# Patient Record
Sex: Female | Born: 1995 | Race: Black or African American | Hispanic: No | Marital: Single | Smoking: Light tobacco smoker
Health system: Southern US, Community
[De-identification: ages and names within clinical notes are randomized; demographics above are authoritative.]

---

## 2014-12-11 ENCOUNTER — Encounter: Payer: Self-pay | Admitting: Family

## 2014-12-11 ENCOUNTER — Ambulatory Visit (INDEPENDENT_AMBULATORY_CARE_PROVIDER_SITE_OTHER): Payer: BC Managed Care – PPO | Admitting: Family

## 2014-12-11 VITALS — BP 104/76 | HR 70 | Temp 97.9°F | Ht 63.8 in | Wt 185.0 lb

## 2014-12-11 DIAGNOSIS — Z0000 Encounter for general adult medical examination without abnormal findings: Secondary | ICD-10-CM

## 2014-12-11 DIAGNOSIS — Z3201 Encounter for pregnancy test, result positive: Secondary | ICD-10-CM

## 2014-12-11 DIAGNOSIS — R350 Frequency of micturition: Secondary | ICD-10-CM

## 2014-12-11 LAB — URINALYSIS
BILIRUBIN URINE: NEGATIVE
Hgb urine dipstick: NEGATIVE
Ketones, ur: NEGATIVE
LEUKOCYTES UA: NEGATIVE
Nitrite: NEGATIVE
SPECIFIC GRAVITY, URINE: 1.025 (ref 1.000–1.030)
Total Protein, Urine: NEGATIVE
UROBILINOGEN UA: 0.2 (ref 0.0–1.0)
Urine Glucose: NEGATIVE
pH: 6 (ref 5.0–8.0)

## 2014-12-11 LAB — CBC WITH DIFFERENTIAL/PLATELET
BASOS ABS: 0 10*3/uL (ref 0.0–0.1)
BASOS PCT: 0.7 % (ref 0.0–3.0)
Eosinophils Absolute: 0 10*3/uL (ref 0.0–0.7)
Eosinophils Relative: 0.6 % (ref 0.0–5.0)
HCT: 36.9 % (ref 36.0–46.0)
HEMOGLOBIN: 12 g/dL (ref 12.0–15.0)
LYMPHS PCT: 28.8 % (ref 12.0–46.0)
Lymphs Abs: 1.7 10*3/uL (ref 0.7–4.0)
MCHC: 32.5 g/dL (ref 30.0–36.0)
MCV: 81.8 fl (ref 78.0–100.0)
MONO ABS: 0.4 10*3/uL (ref 0.1–1.0)
Monocytes Relative: 6.9 % (ref 3.0–12.0)
NEUTROS ABS: 3.8 10*3/uL (ref 1.4–7.7)
NEUTROS PCT: 63 % (ref 43.0–77.0)
Platelets: 338 10*3/uL (ref 150.0–400.0)
RBC: 4.51 Mil/uL (ref 3.87–5.11)
RDW: 15.1 % — AB (ref 11.5–14.6)
WBC: 6.1 10*3/uL (ref 4.5–10.5)

## 2014-12-11 LAB — TSH: TSH: 2.1 u[IU]/mL (ref 0.35–5.50)

## 2014-12-11 LAB — HCG, QUANTITATIVE, PREGNANCY: Quantitative HCG: 0.15 m[IU]/mL

## 2014-12-11 MED ORDER — PRENATAL VITAMINS 28-0.8 MG PO TABS: 1.0000 | ORAL_TABLET | Freq: Every day | ORAL | Status: DC

## 2014-12-11 NOTE — Patient Instructions (Addendum)
Start a prenatal vitamin with folic acid. This is available over the counter.  Please complete lab work prior to leaving. Try to get 30 minutes of walking 5 days a week. Avoid fast food.  Each more fresh/fruits/veggies. You will be contacted with your pregnancy test results and further recommendations.  Follow up in 1 year sooner if problems.
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
## 2014-12-12 ENCOUNTER — Encounter: Payer: Self-pay | Admitting: Family

## 2014-12-12 ENCOUNTER — Telehealth: Payer: Self-pay | Admitting: Family

## 2014-12-12 DIAGNOSIS — Z3201 Encounter for pregnancy test, result positive: Secondary | ICD-10-CM

## 2014-12-12 LAB — BASIC METABOLIC PANEL
BUN: 9 mg/dL (ref 6–23)
CALCIUM: 9.4 mg/dL (ref 8.4–10.5)
CO2: 22 mEq/L (ref 19–32)
CREATININE: 0.7 mg/dL (ref 0.4–1.2)
Chloride: 107 mEq/L (ref 96–112)
GFR: 139.09 mL/min (ref >60.00)
GLUCOSE: 86 mg/dL (ref 70–99)
Potassium: 4 mEq/L (ref 3.5–5.1)
Sodium: 135 mEq/L (ref 135–145)

## 2014-12-12 LAB — HEPATIC FUNCTION PANEL
ALT: 13 U/L (ref 0–35)
AST: 15 U/L (ref 0–37)
Albumin: 3.9 g/dL (ref 3.5–5.2)
Alkaline Phosphatase: 55 U/L (ref 39–117)
BILIRUBIN TOTAL: 0.4 mg/dL (ref 0.2–1.2)
Bilirubin, Direct: 0 mg/dL (ref 0.0–0.3)
Total Protein: 7.7 g/dL (ref 6.0–8.3)

## 2014-12-12 LAB — LIPID PANEL
CHOL/HDL RATIO: 4
Cholesterol: 194 mg/dL (ref 0–200)
HDL: 48.8 mg/dL (ref >39.00)
LDL Cholesterol: 133 mg/dL — ABNORMAL HIGH (ref 0–99)
NonHDL: 145.2
Triglycerides: 61 mg/dL (ref 0.0–149.0)
VLDL: 12.2 mg/dL (ref 0.0–40.0)

## 2014-12-12 NOTE — Telephone Encounter (Signed)
emmi emailed °

## 2014-12-13 ENCOUNTER — Telehealth: Payer: Self-pay | Admitting: Family

## 2014-12-13 DIAGNOSIS — Z0000 Encounter for general adult medical examination without abnormal findings: Secondary | ICD-10-CM | POA: Insufficient documentation

## 2014-12-13 LAB — URINE CULTURE: Colony Count: 8000

## 2014-12-13 NOTE — Assessment & Plan Note (Signed)
Discussed healthy diet, exercise.  Declines flu shot.  Plan pap at 19.

## 2014-12-13 NOTE — Telephone Encounter (Signed)
Pt made aware that labs have not been released yet.  Pt stated "ok."  No further needs at this time.

## 2014-12-13 NOTE — Telephone Encounter (Signed)
Caller name:Silba, Angalina Relation to WU:XLKGpt:self Call back number:[phone] Pharmacy:  Reason for call: pt is wanting to get her results from labs done on Tuesday/

## 2014-12-13 NOTE — Telephone Encounter (Signed)
Please contact pt to arrange serum hcg qualititive in 1 week, dx positive pregnancy test.

## 2014-12-18 NOTE — Telephone Encounter (Signed)
Future order placed and lab appt scheduled for 12/26/14 at 10am.

## 2014-12-26 ENCOUNTER — Other Ambulatory Visit: Payer: BC Managed Care – PPO

## 2015-01-30 ENCOUNTER — Encounter: Payer: Self-pay | Admitting: Family

## 2015-01-30 ENCOUNTER — Telehealth: Payer: Self-pay | Admitting: *Deleted

## 2015-01-30 NOTE — Telephone Encounter (Signed)
Pt did not show for appointment 01/30/15 at 7:15am for "Annual Physical/annual check up, pap smear, blood test, etc/VM full unable to leave message/ MH".  Charge no show fee?

## 2015-01-30 NOTE — Telephone Encounter (Signed)
Yes

## 2015-01-30 NOTE — Telephone Encounter (Signed)
See note below

## 2015-02-22 ENCOUNTER — Encounter: Payer: Self-pay | Admitting: Family

## 2015-03-05 ENCOUNTER — Other Ambulatory Visit (HOSPITAL_COMMUNITY)
Admission: RE | Admit: 2015-03-05 | Discharge: 2015-03-05 | Disposition: A | Discharge status: Home / Self Care | Payer: BLUE CROSS/BLUE SHIELD | Source: Ambulatory Visit | Attending: Family | Admitting: Family

## 2015-03-05 ENCOUNTER — Ambulatory Visit (INDEPENDENT_AMBULATORY_CARE_PROVIDER_SITE_OTHER): Payer: BLUE CROSS/BLUE SHIELD | Admitting: Family

## 2015-03-05 VITALS — BP 122/72 | HR 94 | Temp 98.2°F | Resp 16 | Ht 63.0 in | Wt 189.8 lb

## 2015-03-05 DIAGNOSIS — Z0000 Encounter for general adult medical examination without abnormal findings: Secondary | ICD-10-CM

## 2015-03-05 DIAGNOSIS — Z01419 Encounter for gynecological examination (general) (routine) without abnormal findings: Secondary | ICD-10-CM | POA: Diagnosis not present

## 2015-03-05 DIAGNOSIS — N912 Amenorrhea, unspecified: Secondary | ICD-10-CM

## 2015-03-05 DIAGNOSIS — N910 Primary amenorrhea: Secondary | ICD-10-CM

## 2015-03-05 DIAGNOSIS — Z113 Encounter for screening for infections with a predominantly sexual mode of transmission: Secondary | ICD-10-CM | POA: Diagnosis present

## 2015-03-05 DIAGNOSIS — N926 Irregular menstruation, unspecified: Secondary | ICD-10-CM

## 2015-03-05 LAB — POCT URINE HCG BY VISUAL COLOR COMPARISON TESTS: Preg Test, Ur: NEGATIVE

## 2015-03-05 MED ORDER — NORETHIN ACE-ETH ESTRAD-FE 1.5-30 MG-MCG PO TABS: 1.0000 | ORAL_TABLET | Freq: Every day | ORAL | Status: AC

## 2015-03-05 NOTE — Progress Notes (Signed)
Pre visit review using our clinic review tool, if applicable. No additional management support is needed unless otherwise documented below in the visit note. 

## 2015-03-05 NOTE — Progress Notes (Signed)
   Subjective:    Patient ID: Audrey Duke, female    DOB: 12/13/1996, 18 y.o.   MRN: [id number]  HPI  Ms. Audrey Duke is a 19 yr old female who presents today with report of late period.  LMP was 01/30/15.  Reports light vaginal bleeding x 2 days.  Reports home test was negative on Friday. + breast tenderness, not using birth control currently. No GYN.   Review of Systems See HPI  No past medical history on file.  History   Social History  . Marital Status: Single    Spouse Name: N/A  . Number of Children: N/A  . Years of Education: N/A   Occupational History  . Not on file.   Social History Main Topics  . Smoking status: Light Tobacco Smoker  . Smokeless tobacco: Not on file     Comment: only smoked 1 cigarette and does not plan to smoke again  . Alcohol Use: 0.0 oz/week    0 Standard drinks or equivalent per week     Comment: rare alcohol  . Drug Use: No  . Sexual Activity:    Partners: Male     Comment: no birth control currently    Other Topics Concern  . 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  Family History  Problem Relation Age of Onset  . Hypertension Maternal Grandmother   . Diabetes Mellitus II Maternal Grandmother     No Known Allergies  No current outpatient prescriptions on file prior to visit.   No current facility-administered medications on file prior to visit.    BP 122/72 mmHg  Pulse 94  Temp(Src) 98.2 F (36.8 C) (Oral)  Resp 16  Ht 5\' 3"  (1.6 m)  Wt 189 lb 12.8 oz (86.093 kg)  BMI 33.63 kg/m2  SpO2 99%  LMP 01/30/2015       Objective:   Physical Exam  Constitutional: She appears well-developed and well-nourished.  Cardiovascular: Normal rate, regular rhythm and normal heart sounds.   No  murmur heard. Pulmonary/Chest: Effort normal and breath sounds normal. No respiratory distress. She has no wheezes.  Psychiatric: She has a normal mood and affect. Her behavior is normal. Judgment and thought content normal.  External genitalia: Normal  BUS/Urethra/Skene's glands: Normal  Bladder: Normal  Vagina: Normal  Cervix: Normal  Uterus: normal in size, shape and contour. Midline and mobile  Adnexa/parametria:  Rt: Without masses or tenderness.  Lt: Without masses or tenderness.  Anus and perineum: Normal         Assessment & Plan:

## 2015-03-05 NOTE — Patient Instructions (Addendum)
Start birth control pill. Always use condoms. Schedule a complete physical at the front desk.

## 2015-03-06 LAB — HIV ANTIBODY (ROUTINE TESTING W REFLEX): HIV 1&2 Ab, 4th Generation: NONREACTIVE

## 2015-03-06 LAB — HCG, SERUM, QUALITATIVE: PREG SERUM: NEGATIVE

## 2015-03-07 ENCOUNTER — Encounter: Payer: Self-pay | Admitting: Family

## 2015-03-07 DIAGNOSIS — N912 Amenorrhea, unspecified: Secondary | ICD-10-CM | POA: Insufficient documentation

## 2015-03-07 NOTE — Assessment & Plan Note (Signed)
HCG negative. Desires birth control, initiate OCP. Requests HIV screen.  Pap performed.

## 2015-03-08 LAB — CYTOLOGY - PAP

## 2015-03-10 ENCOUNTER — Encounter: Payer: Self-pay | Admitting: Family

## 2015-03-11 ENCOUNTER — Encounter: Payer: Self-pay | Admitting: Family

## 2015-03-14 ENCOUNTER — Telehealth: Payer: Self-pay | Admitting: Family

## 2015-03-14 NOTE — Telephone Encounter (Signed)
Pt was no show for appt on 03/11/15- last seen for OV on 03/05/15- charge no show?

## 2015-03-14 NOTE — Telephone Encounter (Signed)
Yes. Tks.  °

## 2015-09-24 ENCOUNTER — Emergency Department (HOSPITAL_COMMUNITY): Payer: BLUE CROSS/BLUE SHIELD

## 2015-09-24 ENCOUNTER — Encounter (HOSPITAL_COMMUNITY): Payer: Self-pay

## 2015-09-24 ENCOUNTER — Emergency Department (HOSPITAL_COMMUNITY)
Admission: EM | Admit: 2015-09-24 | Discharge: 2015-09-24 | Disposition: A | Discharge status: Home / Self Care | Payer: BLUE CROSS/BLUE SHIELD | Attending: Emergency Medicine | Admitting: Emergency Medicine

## 2015-09-24 DIAGNOSIS — Z79818 Long term (current) use of other agents affecting estrogen receptors and estrogen levels: Secondary | ICD-10-CM | POA: Insufficient documentation

## 2015-09-24 DIAGNOSIS — Z720 Tobacco use: Secondary | ICD-10-CM | POA: Diagnosis not present

## 2015-09-24 DIAGNOSIS — Y9372 Activity, wrestling: Secondary | ICD-10-CM | POA: Diagnosis not present

## 2015-09-24 DIAGNOSIS — Y9289 Other specified places as the place of occurrence of the external cause: Secondary | ICD-10-CM | POA: Diagnosis not present

## 2015-09-24 DIAGNOSIS — Y998 Other external cause status: Secondary | ICD-10-CM | POA: Insufficient documentation

## 2015-09-24 DIAGNOSIS — I10 Essential (primary) hypertension: Secondary | ICD-10-CM | POA: Insufficient documentation

## 2015-09-24 DIAGNOSIS — S4992XA Unspecified injury of left shoulder and upper arm, initial encounter: Secondary | ICD-10-CM | POA: Diagnosis present

## 2015-09-24 DIAGNOSIS — E119 Type 2 diabetes mellitus without complications: Secondary | ICD-10-CM | POA: Diagnosis not present

## 2015-09-24 DIAGNOSIS — S46912A Strain of unspecified muscle, fascia and tendon at shoulder and upper arm level, left arm, initial encounter: Secondary | ICD-10-CM | POA: Insufficient documentation

## 2015-09-24 DIAGNOSIS — W1839XA Other fall on same level, initial encounter: Secondary | ICD-10-CM | POA: Insufficient documentation

## 2015-09-24 MED ORDER — IBUPROFEN 800 MG PO TABS: 800.0000 mg | ORAL_TABLET | Freq: Three times a day (TID) | ORAL | Status: AC

## 2015-09-24 NOTE — Discharge Instructions (Signed)
Cryotherapy °Cryotherapy means treatment with cold. Ice or gel packs can be used to reduce both pain and swelling. Ice is the most helpful within the first 24 to 48 hours after an injury or flare-up from overusing a muscle or joint. Sprains, strains, spasms, burning pain, shooting pain, and aches can all be eased with ice. Ice can also be used when recovering from surgery. Ice is effective, has very few side effects, and is safe for most people to use. °PRECAUTIONS  °Ice is not a safe treatment option for people with: °· Raynaud phenomenon. This is a condition affecting small blood vessels in the extremities. Exposure to cold may cause your problems to return. °· Cold hypersensitivity. There are many forms of cold hypersensitivity, including: °· Cold urticaria. Red, itchy hives appear on the skin when the tissues begin to warm after being iced. °· Cold erythema. This is a red, itchy rash caused by exposure to cold. °· Cold hemoglobinuria. Red blood cells break down when the tissues begin to warm after being iced. The hemoglobin that carry oxygen are passed into the urine because they cannot combine with blood proteins fast enough. °· Numbness or altered sensitivity in the area being iced. °If you have any of the following conditions, do not use ice until you have discussed cryotherapy with your caregiver: °· Heart conditions, such as arrhythmia, angina, or chronic heart disease. °· High blood pressure. °· Healing wounds or open skin in the area being iced. °· Current infections. °· Rheumatoid arthritis. °· Poor circulation. °· Diabetes. °Ice slows the blood flow in the region it is applied. This is beneficial when trying to stop inflamed tissues from spreading irritating chemicals to surrounding tissues. However, if you expose your skin to cold temperatures for too long or without the proper protection, you can damage your skin or nerves. Watch for signs of skin damage due to cold. °HOME CARE INSTRUCTIONS °Follow  these tips to use ice and cold packs safely. °· Place a dry or damp towel between the ice and skin. A damp towel will cool the skin more quickly, so you may need to shorten the time that the ice is used. °· For a more rapid response, add gentle compression to the ice. °· Ice for no more than 10 to 20 minutes at a time. The bonier the area you are icing, the less time it will take to get the benefits of ice. °· Check your skin after 5 minutes to make sure there are no signs of a poor response to cold or skin damage. °· Rest 20 minutes or more between uses. °· Once your skin is numb, you can end your treatment. You can test numbness by very lightly touching your skin. The touch should be so light that you do not see the skin dimple from the pressure of your fingertip. When using ice, most people will feel these normal sensations in this order: cold, burning, aching, and numbness. °· Do not use ice on someone who cannot communicate their responses to pain, such as small children or people with dementia. °HOW TO MAKE AN ICE PACK °Ice packs are the most common way to use ice therapy. Other methods include ice massage, ice baths, and cryosprays. Muscle creams that cause a cold, tingly feeling do not offer the same benefits that ice offers and should not be used as a substitute unless recommended by your caregiver. °To make an ice pack, do one of the following: °· Place crushed ice or a   bag of frozen vegetables in a sealable plastic bag. Squeeze out the excess air. Place this bag inside another plastic bag. Slide the bag into a pillowcase or place a damp towel between your skin and the bag. °· Mix 3 parts water with 1 part rubbing alcohol. Freeze the mixture in a sealable plastic bag. When you remove the mixture from the freezer, it will be slushy. Squeeze out the excess air. Place this bag inside another plastic bag. Slide the bag into a pillowcase or place a damp towel between your skin and the bag. °SEEK MEDICAL CARE  IF: °· You develop white spots on your skin. This may give the skin a blotchy (mottled) appearance. °· Your skin turns blue or pale. °· Your skin becomes waxy or hard. °· Your swelling gets worse. °MAKE SURE YOU:  °· Understand these instructions. °· Will watch your condition. °· Will get help right away if you are not doing well or get worse. °Document Released: 08/10/2011 Document Revised: 04/30/2014 Document Reviewed: 08/10/2011 °ExitCare® Patient Information ©2015 [employer]. This information is not intended to replace advice given to you by your health care provider. Make sure you discuss any questions you have with your health care provider. ° °Joint Sprain °A sprain is a tear or stretch in the ligaments that hold a joint together. Severe sprains may need as long as 3-6 weeks of immobilization and/or exercises to heal completely. Sprained joints should be rested and protected. If not, they can become unstable and prone to re-injury. Proper treatment can reduce your pain, shorten the period of disability, and reduce the risk of repeated injuries. °TREATMENT  °· Rest and elevate the injured joint to reduce pain and swelling. °· Apply ice packs to the injury for 20-30 minutes every 2-3 hours for the next 2-3 days. °· Keep the injury wrapped in a compression bandage or splint as long as the joint is painful or as instructed by your caregiver. °· Do not use the injured joint until it is completely healed to prevent re-injury and chronic instability. Follow the instructions of your caregiver. °· Long-term sprain management may require exercises and/or treatment by a physical therapist. Taping or special braces may help stabilize the joint until it is completely better. °SEEK MEDICAL CARE IF:  °· You develop increased pain or swelling of the joint. °· You develop increasing redness and warmth of the joint. °· You develop a fever. °· It becomes stiff. °· Your hand or foot gets cold or numb. °Document Released:  01/21/2005 Document Revised: 03/07/2012 Document Reviewed: 12/31/2008 °ExitCare® Patient Information ©2015 [employer]. This information is not intended to replace advice given to you by your health care provider. Make sure you discuss any questions you have with your health care provider. ° °

## 2015-09-24 NOTE — ED Notes (Signed)
Pt was wrestling with friend and thinks she dislocated her left shoulder

## 2015-09-24 NOTE — ED Provider Notes (Signed)
CSN: [id number]     Arrival date & time 09/24/15  1932 History  By signing my name below, I, Emmanuella Mensah, attest that this documentation has been prepared under the direction and in the presence of Elpidio Anis, PA-C. Electronically Signed: Angelene Giovanni, ED Scribe. 09/24/2015. 8:50 PM.     Chief Complaint  Patient presents with  . Shoulder Injury   The history is provided by the patient. No language interpreter was used.   HPI Comments: Audrey Duke is a 19 y.o. female with a hx of HTN and DM II who presents to the Emergency Department status post left shoulder injury that occurred about 2 hours ago. She reports associated moderate left shoulder pain that is worse with movement. She reports that she fell on the ground while playing around wrestling with a friend. No alleviating factors noted. She states that she works at a [employer]. She reports NKDA.   History reviewed. No pertinent past medical history. History reviewed. No pertinent past surgical history. Family History  Problem Relation Age of Onset  . Hypertension Maternal Grandmother   . Diabetes Mellitus II Maternal Grandmother    Social History  Substance Use Topics  . Smoking status: Light Tobacco Smoker  . Smokeless tobacco: None     Comment: only smoked 1 cigarette and does not plan to smoke again  . Alcohol Use: 0.0 oz/week    0 Standard drinks or equivalent per week     Comment: rare alcohol   OB History    No data available     Review of Systems  Constitutional: Negative for fever.  Cardiovascular: Negative for chest pain.  Musculoskeletal: Positive for arthralgias. Negative for neck pain.  Skin: Negative for color change and wound.  Neurological: Negative for weakness and numbness.      Allergies  Review of patient's allergies indicates no known allergies.  Home Medications   Prior to Admission medications   Medication Sig Start Date End Date Taking? Authorizing Provider   norethindrone-ethinyl estradiol-iron (MICROGESTIN FE 1.5/30) 1.5-30 MG-MCG tablet Take 1 tablet by mouth daily. 03/05/15   Sandford Craze, NP   BP 123/81 mmHg  Pulse 91  Temp(Src) 98.8 F (37.1 C) (Oral)  Resp 20  Ht  (1.626 m)  Wt 180 lb (81.647 kg)  BMI 30.88 kg/m2  SpO2 100%  LMP 09/22/2015 Physical Exam  Constitutional: She is oriented to person, place, and time. She appears well-developed and well-nourished.  HENT:  Head: Normocephalic and atraumatic.  Cardiovascular: Normal rate.   Pulmonary/Chest: Effort normal.  Abdominal: She exhibits no distension.  Musculoskeletal: Normal range of motion. She exhibits no tenderness.  Left shoulder without obvious bony deformities Tenderness over deltoid No swelling or redness No bruising Full ROM of shoulder and other joints of the upper extremity Normal grip strength Pulses intact distally No midline tenderness.   Neurological: She is alert and oriented to person, place, and time.  Skin: Skin is warm and dry.  Psychiatric: She has a normal mood and affect.  Nursing note and vitals reviewed.   ED Course  Procedures (including critical care time) DIAGNOSTIC STUDIES: Oxygen Saturation is 100% on RA, normal by my interpretation.    COORDINATION OF CARE: 8:41 PM- Pt advised of plan for treatment and pt agrees.   Imaging Review Dg Shoulder Left  09/24/2015   CLINICAL DATA:  Left shoulder pain.  EXAM: LEFT SHOULDER - 2+ VIEW  COMPARISON:  None.  FINDINGS: There is no evidence of fracture or dislocation.  There is no evidence of arthropathy or other focal bone abnormality. Soft tissues are unremarkable.  IMPRESSION: Negative.   Electronically Signed   By: Elige Ko   On: 09/24/2015 20:31   Elpidio Anis, PA-C has personally reviewed and evaluated these images as part of her medical decision-making.   EKG Interpretation None      MDM   Final diagnoses:  None    1. Left shoulder sprain  Uncomplicated shoulder  strain injury without bony derangement.   I personally performed the services described in this documentation, which was scribed in my presence. The recorded information has been reviewed and is accurate.     Elpidio Anis, PA-C 09/24/15 2055  Lyndal Pulley, MD 09/25/15 [phone]

## 2016-07-15 ENCOUNTER — Ambulatory Visit: Payer: Medicaid Other | Admitting: Family

## 2016-07-15 DIAGNOSIS — Z0289 Encounter for other administrative examinations: Secondary | ICD-10-CM

## 2016-07-16 ENCOUNTER — Encounter: Payer: Self-pay | Admitting: Family

## 2016-11-06 IMAGING — CR DG SHOULDER 2+V*L*
3 series · 3 of 3 positions shown · non-contrast
Comparison: None.

CLINICAL DATA: Left shoulder pain.

EXAM:
LEFT SHOULDER - 2+ VIEW

[w shoulder external left]
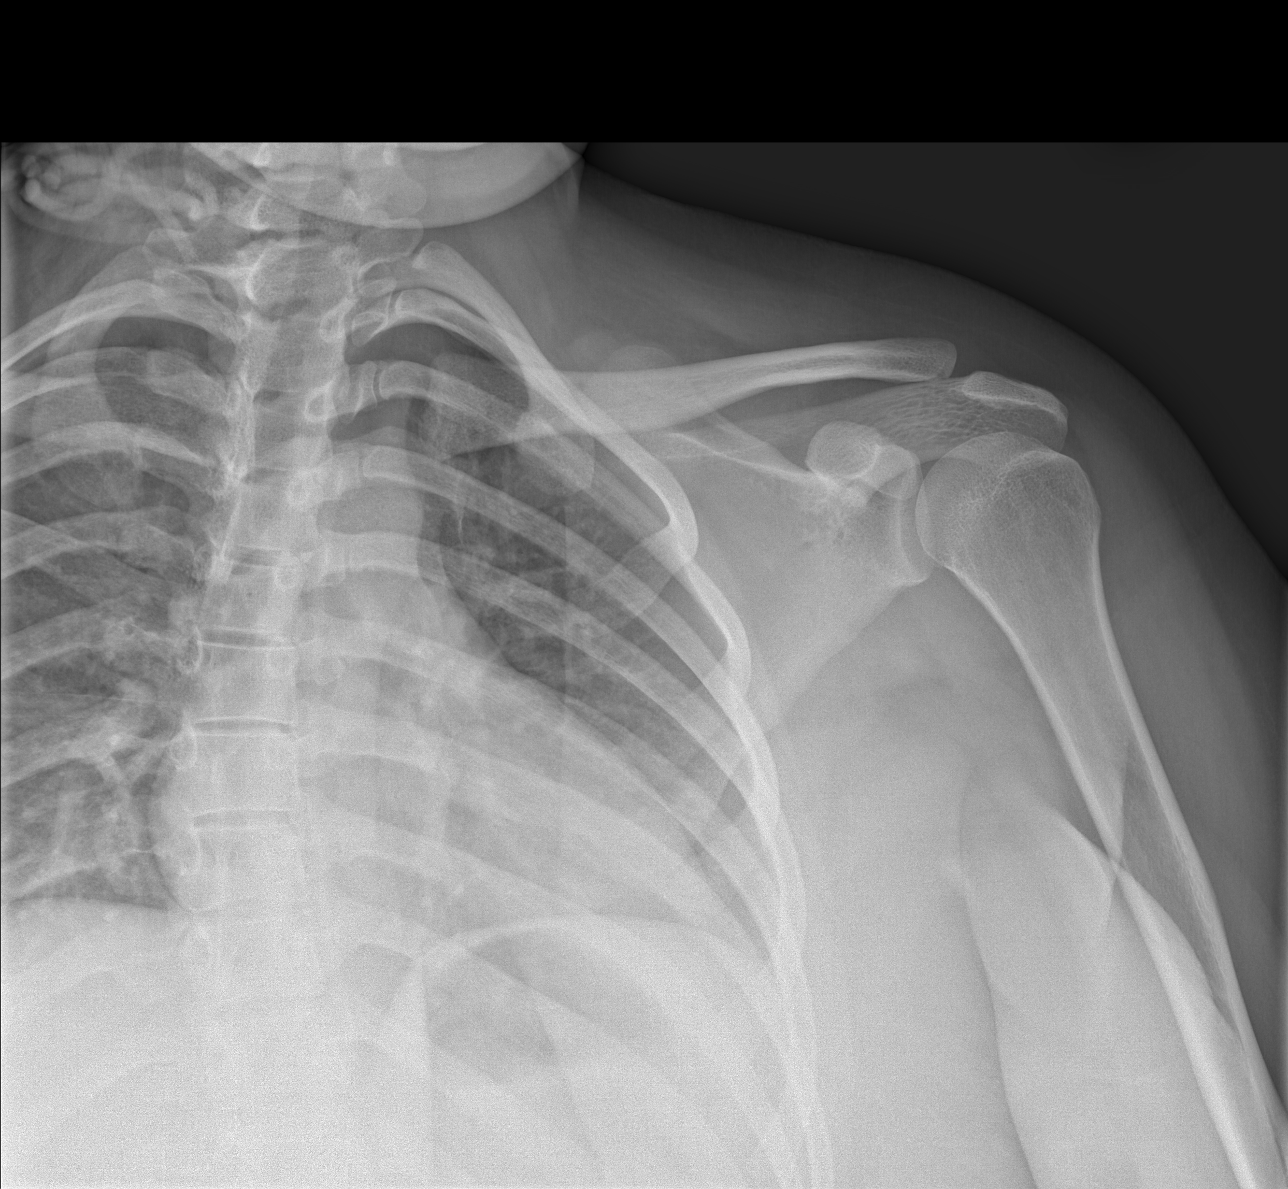

[w shoulder y-view left]
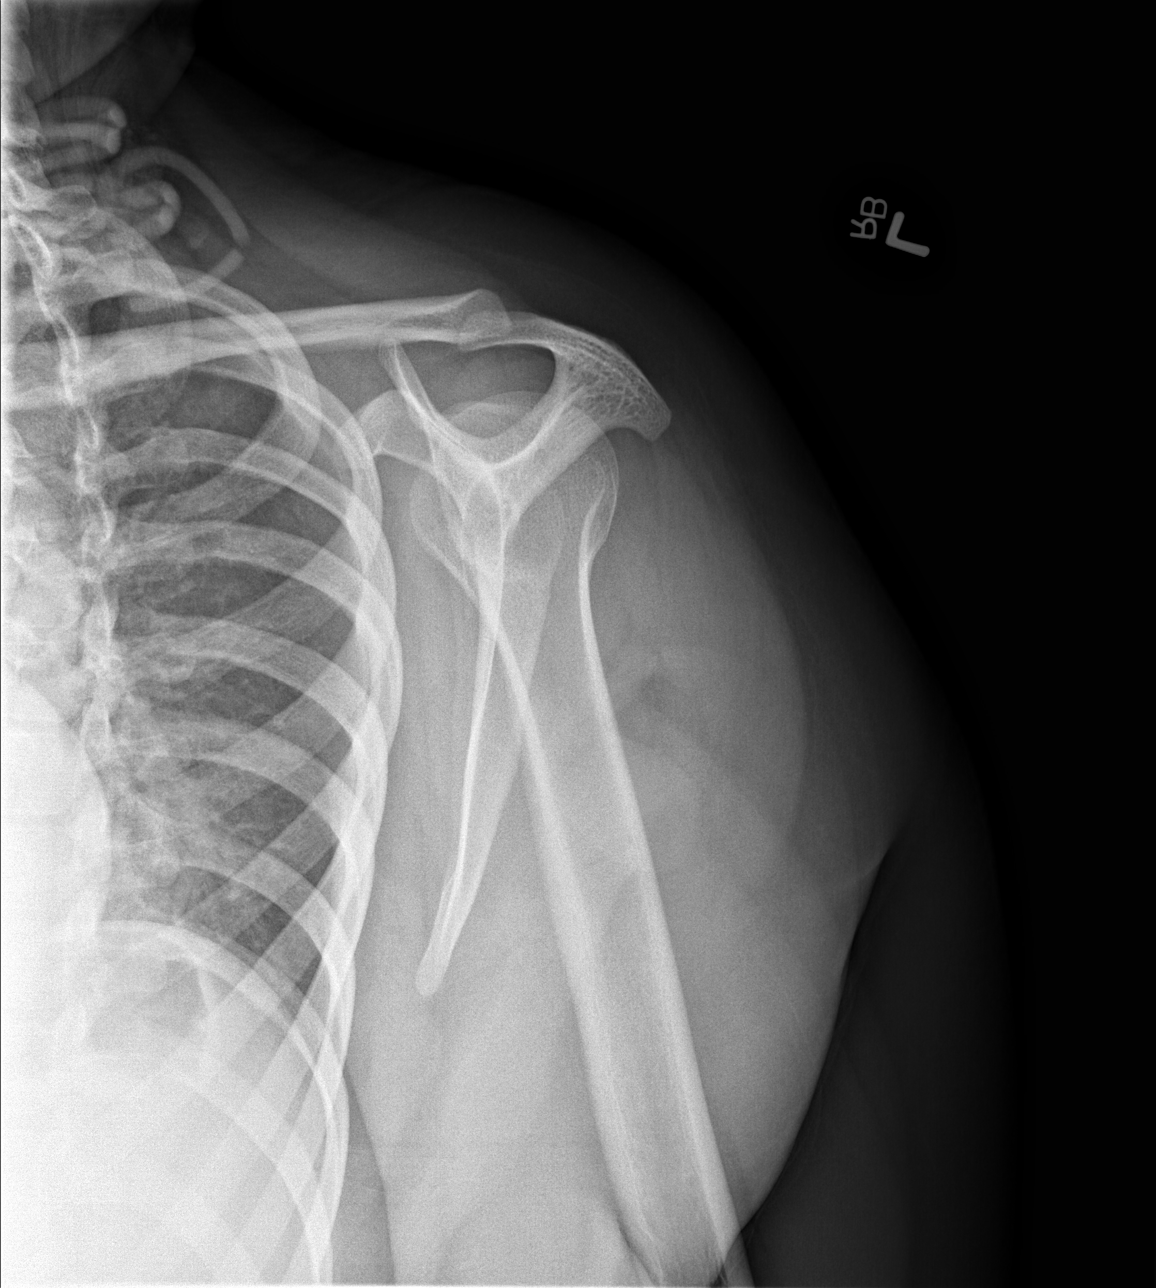

[x shoulder axillary left]
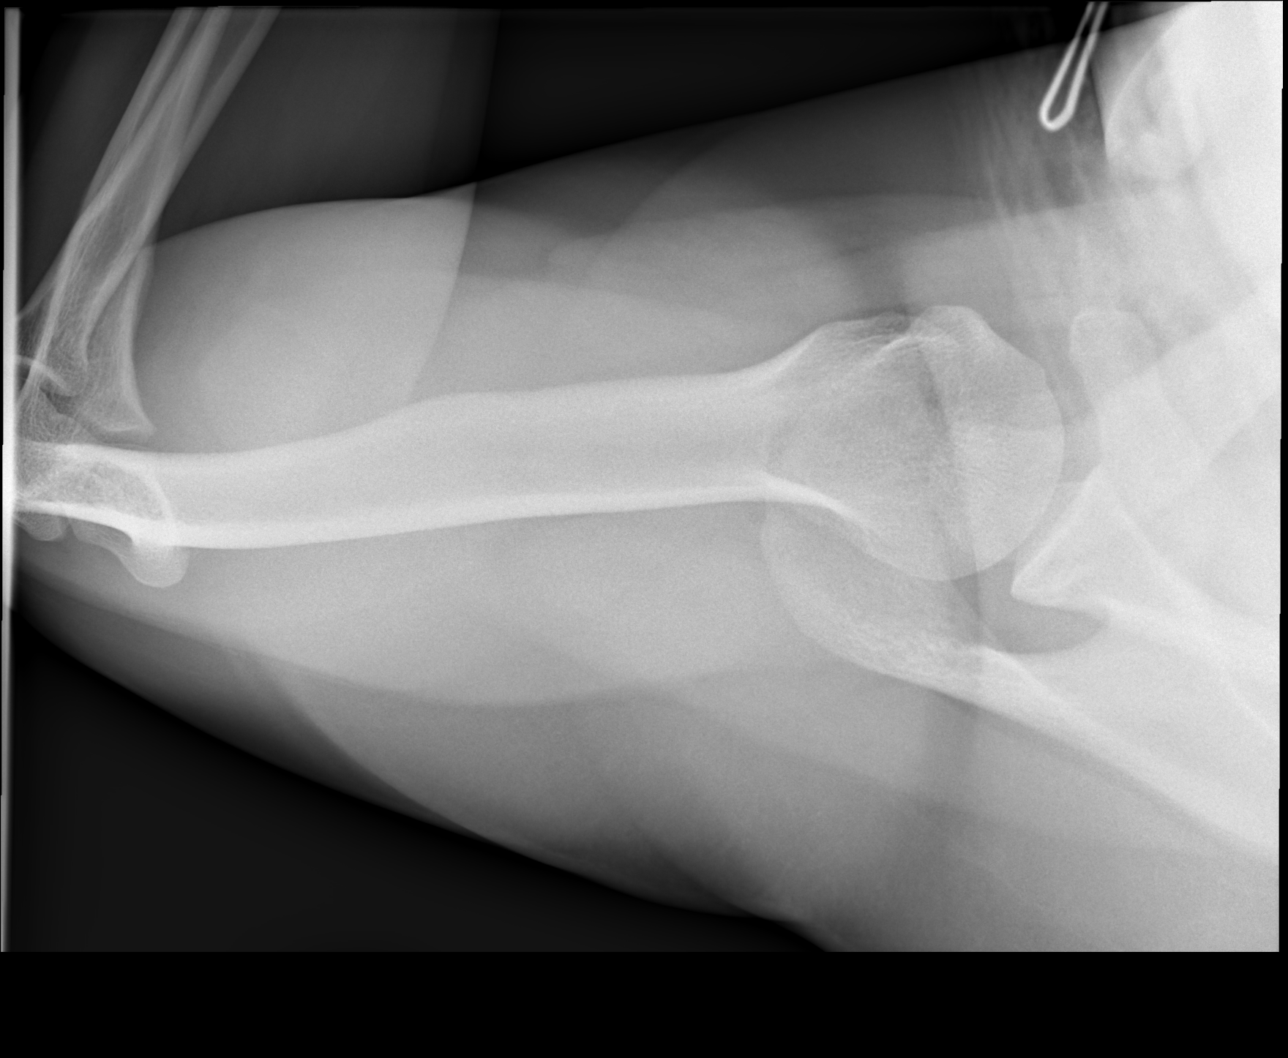

[3 of 3 positions shown; findings below may reference images not displayed]

FINDINGS: There is no evidence of fracture or dislocation. There is no
evidence of arthropathy or other focal bone abnormality. Soft
tissues are unremarkable.
IMPRESSION: Negative.
